# Patient Record
Sex: Male | Born: 2000
Health system: Southern US, Community
[De-identification: ages and names within clinical notes are randomized; demographics above are authoritative.]

---
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
## 2021-01-16 ENCOUNTER — Other Ambulatory Visit (HOSPITAL_COMMUNITY): Payer: Self-pay

## 2021-02-10 ENCOUNTER — Other Ambulatory Visit (HOSPITAL_COMMUNITY): Payer: Self-pay

## 2021-02-16 ENCOUNTER — Other Ambulatory Visit (HOSPITAL_COMMUNITY): Payer: Self-pay

## 2021-03-08 ENCOUNTER — Other Ambulatory Visit (HOSPITAL_COMMUNITY): Payer: Self-pay

## 2021-03-09 ENCOUNTER — Other Ambulatory Visit (HOSPITAL_COMMUNITY): Payer: Self-pay

## 2021-03-17 ENCOUNTER — Other Ambulatory Visit (HOSPITAL_BASED_OUTPATIENT_CLINIC_OR_DEPARTMENT_OTHER): Payer: Self-pay

## 2021-04-03 ENCOUNTER — Other Ambulatory Visit (HOSPITAL_COMMUNITY): Payer: Self-pay

## 2021-04-06 ENCOUNTER — Other Ambulatory Visit (HOSPITAL_COMMUNITY): Payer: Self-pay

## 2021-04-18 ENCOUNTER — Other Ambulatory Visit (HOSPITAL_COMMUNITY): Payer: Self-pay

## 2021-04-28 ENCOUNTER — Other Ambulatory Visit (HOSPITAL_COMMUNITY): Payer: Self-pay

## 2021-05-05 ENCOUNTER — Other Ambulatory Visit (HOSPITAL_COMMUNITY): Payer: Self-pay

## 2021-05-09 ENCOUNTER — Other Ambulatory Visit (HOSPITAL_COMMUNITY): Payer: Self-pay

## 2021-05-16 ENCOUNTER — Other Ambulatory Visit (HOSPITAL_COMMUNITY): Payer: Self-pay

## 2021-06-02 ENCOUNTER — Other Ambulatory Visit (HOSPITAL_COMMUNITY): Payer: Self-pay

## 2021-06-05 ENCOUNTER — Other Ambulatory Visit (HOSPITAL_COMMUNITY): Payer: Self-pay

## 2021-06-09 ENCOUNTER — Other Ambulatory Visit (HOSPITAL_COMMUNITY): Payer: Self-pay

## 2021-06-27 ENCOUNTER — Other Ambulatory Visit (HOSPITAL_COMMUNITY): Payer: Self-pay

## 2021-06-29 ENCOUNTER — Other Ambulatory Visit (HOSPITAL_COMMUNITY): Payer: Self-pay

## 2021-07-13 ENCOUNTER — Other Ambulatory Visit (HOSPITAL_COMMUNITY): Payer: Self-pay

## 2021-07-19 ENCOUNTER — Other Ambulatory Visit (HOSPITAL_COMMUNITY): Payer: Self-pay

## 2021-07-26 ENCOUNTER — Other Ambulatory Visit (HOSPITAL_COMMUNITY): Payer: Self-pay

## 2021-08-15 ENCOUNTER — Other Ambulatory Visit (HOSPITAL_COMMUNITY): Payer: Self-pay

## 2021-08-23 ENCOUNTER — Other Ambulatory Visit (HOSPITAL_COMMUNITY): Payer: Self-pay

## 2021-09-05 ENCOUNTER — Other Ambulatory Visit (HOSPITAL_COMMUNITY): Payer: Self-pay

## 2021-09-13 ENCOUNTER — Other Ambulatory Visit (HOSPITAL_COMMUNITY): Payer: Self-pay

## 2021-09-22 ENCOUNTER — Other Ambulatory Visit (HOSPITAL_COMMUNITY): Payer: Self-pay

## 2021-09-28 ENCOUNTER — Other Ambulatory Visit (HOSPITAL_COMMUNITY): Payer: Self-pay

## 2021-10-11 ENCOUNTER — Other Ambulatory Visit (HOSPITAL_COMMUNITY): Payer: Self-pay

## 2021-10-19 ENCOUNTER — Other Ambulatory Visit (HOSPITAL_COMMUNITY): Payer: Self-pay

## 2021-10-30 ENCOUNTER — Telehealth: Payer: Self-pay | Admitting: Pharmacist

## 2021-10-30 NOTE — Telephone Encounter (Signed)
Called patient to schedule an appointment for the [place] [employer] [facility]. I was unable to reach the patient so I left a HIPAA-compliant message requesting that the patient return my call.   Luke Van Ausdall, PharmD, BCACP, CPP Clinical Pharmacist [facility] [phone]  

## 2021-10-31 ENCOUNTER — Other Ambulatory Visit (HOSPITAL_COMMUNITY): Payer: Self-pay

## 2021-11-09 ENCOUNTER — Other Ambulatory Visit (HOSPITAL_COMMUNITY): Payer: Self-pay

## 2021-11-23 ENCOUNTER — Other Ambulatory Visit (HOSPITAL_COMMUNITY): Payer: Self-pay

## 2021-11-28 ENCOUNTER — Other Ambulatory Visit (HOSPITAL_COMMUNITY): Payer: Self-pay

## 2021-12-12 ENCOUNTER — Other Ambulatory Visit (HOSPITAL_COMMUNITY): Payer: Self-pay

## 2021-12-12 MED ORDER — ADALIMUMAB 40 MG/0.8ML ~~LOC~~ AJKT
AUTO-INJECTOR | SUBCUTANEOUS | 11 refills | Status: DC
  Filled 2021-12-12 – 2022-01-04 (×2): qty 2, 28d supply, fill #0
  Filled 2022-02-12: qty 2, 28d supply, fill #1

## 2022-01-04 ENCOUNTER — Other Ambulatory Visit (HOSPITAL_COMMUNITY): Payer: Self-pay

## 2022-01-08 ENCOUNTER — Other Ambulatory Visit (HOSPITAL_COMMUNITY): Payer: Self-pay

## 2022-01-12 ENCOUNTER — Telehealth: Payer: Self-pay | Admitting: Pharmacist

## 2022-01-12 NOTE — Telephone Encounter (Signed)
Called patient to schedule an appointment for the [place] [employer] [facility]. I was unable to reach the patient so I left a HIPAA-compliant message requesting that the patient return my call.   Luke Van Ausdall, PharmD, BCACP, CPP Clinical Pharmacist [facility] [phone]  

## 2022-01-22 ENCOUNTER — Other Ambulatory Visit (HOSPITAL_COMMUNITY): Payer: Self-pay

## 2022-01-23 ENCOUNTER — Other Ambulatory Visit (HOSPITAL_COMMUNITY): Payer: Self-pay

## 2022-01-26 ENCOUNTER — Other Ambulatory Visit: Payer: Self-pay

## 2022-01-30 ENCOUNTER — Other Ambulatory Visit (HOSPITAL_COMMUNITY): Payer: Self-pay

## 2022-01-30 ENCOUNTER — Other Ambulatory Visit: Payer: Self-pay

## 2022-01-30 ENCOUNTER — Other Ambulatory Visit (HOSPITAL_BASED_OUTPATIENT_CLINIC_OR_DEPARTMENT_OTHER): Payer: Self-pay

## 2022-01-31 ENCOUNTER — Other Ambulatory Visit (HOSPITAL_COMMUNITY): Payer: Self-pay

## 2022-02-12 ENCOUNTER — Other Ambulatory Visit (HOSPITAL_COMMUNITY): Payer: Self-pay

## 2022-02-12 ENCOUNTER — Other Ambulatory Visit: Payer: Self-pay

## 2022-02-13 ENCOUNTER — Other Ambulatory Visit (HOSPITAL_COMMUNITY): Payer: Self-pay

## 2022-02-14 ENCOUNTER — Other Ambulatory Visit (HOSPITAL_COMMUNITY): Payer: Self-pay

## 2022-02-15 ENCOUNTER — Other Ambulatory Visit (HOSPITAL_COMMUNITY): Payer: Self-pay

## 2022-02-16 ENCOUNTER — Other Ambulatory Visit (HOSPITAL_COMMUNITY): Payer: Self-pay

## 2022-02-19 ENCOUNTER — Other Ambulatory Visit (HOSPITAL_COMMUNITY): Payer: Self-pay

## 2022-02-20 ENCOUNTER — Other Ambulatory Visit (HOSPITAL_COMMUNITY): Payer: Self-pay

## 2022-03-06 ENCOUNTER — Other Ambulatory Visit (HOSPITAL_COMMUNITY): Payer: Self-pay

## 2022-03-08 ENCOUNTER — Other Ambulatory Visit: Payer: Self-pay

## 2022-03-08 ENCOUNTER — Other Ambulatory Visit (HOSPITAL_COMMUNITY): Payer: Self-pay

## 2022-03-08 MED ORDER — HUMIRA (2 PEN) 40 MG/0.8ML ~~LOC~~ AJKT
AUTO-INJECTOR | SUBCUTANEOUS | 9 refills | Status: AC
  Filled 2022-03-12: qty 2, 28d supply, fill #0
  Filled 2022-04-16 – 2022-05-01 (×3): qty 2, 28d supply, fill #1
  Filled 2022-05-17: qty 2, 28d supply, fill #2
  Filled 2022-06-22: qty 2, 28d supply, fill #3
  Filled 2022-07-23: qty 2, 28d supply, fill #4

## 2022-03-12 ENCOUNTER — Other Ambulatory Visit (HOSPITAL_COMMUNITY): Payer: Self-pay

## 2022-03-12 ENCOUNTER — Other Ambulatory Visit: Payer: Self-pay

## 2022-03-16 ENCOUNTER — Other Ambulatory Visit (HOSPITAL_COMMUNITY): Payer: Self-pay

## 2022-03-19 ENCOUNTER — Other Ambulatory Visit: Payer: Self-pay

## 2022-03-20 ENCOUNTER — Other Ambulatory Visit: Payer: Self-pay

## 2022-03-20 ENCOUNTER — Other Ambulatory Visit (HOSPITAL_COMMUNITY): Payer: Self-pay

## 2022-03-21 ENCOUNTER — Other Ambulatory Visit: Payer: Self-pay

## 2022-03-24 ENCOUNTER — Other Ambulatory Visit (HOSPITAL_COMMUNITY): Payer: Self-pay

## 2022-03-26 ENCOUNTER — Other Ambulatory Visit (HOSPITAL_COMMUNITY): Payer: Self-pay

## 2022-04-12 ENCOUNTER — Other Ambulatory Visit (HOSPITAL_COMMUNITY): Payer: Self-pay

## 2022-04-16 ENCOUNTER — Other Ambulatory Visit: Payer: Self-pay

## 2022-04-16 ENCOUNTER — Other Ambulatory Visit (HOSPITAL_COMMUNITY): Payer: Self-pay

## 2022-05-01 ENCOUNTER — Other Ambulatory Visit (HOSPITAL_COMMUNITY): Payer: Self-pay

## 2022-05-17 ENCOUNTER — Other Ambulatory Visit (HOSPITAL_COMMUNITY): Payer: Self-pay

## 2022-05-24 ENCOUNTER — Other Ambulatory Visit: Payer: Self-pay

## 2022-05-31 ENCOUNTER — Other Ambulatory Visit (HOSPITAL_COMMUNITY): Payer: Self-pay

## 2022-06-13 ENCOUNTER — Other Ambulatory Visit (HOSPITAL_COMMUNITY): Payer: Self-pay

## 2022-06-14 ENCOUNTER — Other Ambulatory Visit (HOSPITAL_COMMUNITY): Payer: Self-pay

## 2022-06-21 ENCOUNTER — Other Ambulatory Visit (HOSPITAL_COMMUNITY): Payer: Self-pay

## 2022-06-22 ENCOUNTER — Other Ambulatory Visit: Payer: Self-pay

## 2022-06-22 ENCOUNTER — Other Ambulatory Visit (HOSPITAL_COMMUNITY): Payer: Self-pay

## 2022-06-27 ENCOUNTER — Other Ambulatory Visit (HOSPITAL_COMMUNITY): Payer: Self-pay

## 2022-07-06 ENCOUNTER — Other Ambulatory Visit (HOSPITAL_COMMUNITY): Payer: Self-pay

## 2022-07-18 ENCOUNTER — Other Ambulatory Visit: Payer: Self-pay

## 2022-07-20 ENCOUNTER — Other Ambulatory Visit (HOSPITAL_COMMUNITY): Payer: Self-pay

## 2022-07-20 ENCOUNTER — Other Ambulatory Visit: Payer: Self-pay

## 2022-07-23 ENCOUNTER — Other Ambulatory Visit: Payer: Self-pay

## 2022-07-24 ENCOUNTER — Other Ambulatory Visit (HOSPITAL_COMMUNITY): Payer: Self-pay

## 2022-07-25 ENCOUNTER — Other Ambulatory Visit (HOSPITAL_COMMUNITY): Payer: Self-pay

## 2022-08-02 ENCOUNTER — Other Ambulatory Visit (HOSPITAL_COMMUNITY): Payer: Self-pay

## 2022-08-10 ENCOUNTER — Other Ambulatory Visit (HOSPITAL_COMMUNITY): Payer: Self-pay

## 2022-08-14 ENCOUNTER — Other Ambulatory Visit: Payer: Self-pay

## 2022-10-27 ENCOUNTER — Encounter (HOSPITAL_COMMUNITY): Payer: Self-pay
